# Patient Record
Sex: Male | Born: 1965 | Hispanic: No | Marital: Married | Smoking: Former smoker
Health system: Southern US, Community
[De-identification: ages and names within clinical notes are randomized; demographics above are authoritative.]

## PROBLEM LIST (undated history)

## (undated) ENCOUNTER — Emergency Department (HOSPITAL_COMMUNITY): Discharge status: Absent without leave | Payer: Self-pay

## (undated) DIAGNOSIS — K219 Gastro-esophageal reflux disease without esophagitis: Secondary | ICD-10-CM

## (undated) DIAGNOSIS — G4733 Obstructive sleep apnea (adult) (pediatric): Secondary | ICD-10-CM

## (undated) DIAGNOSIS — K589 Irritable bowel syndrome without diarrhea: Secondary | ICD-10-CM

## (undated) DIAGNOSIS — N400 Enlarged prostate without lower urinary tract symptoms: Secondary | ICD-10-CM

## (undated) DIAGNOSIS — R519 Headache, unspecified: Secondary | ICD-10-CM

## (undated) DIAGNOSIS — N200 Calculus of kidney: Secondary | ICD-10-CM

## (undated) DIAGNOSIS — D563 Thalassemia minor: Secondary | ICD-10-CM

## (undated) DIAGNOSIS — R51 Headache: Secondary | ICD-10-CM

## (undated) HISTORY — DX: Obstructive sleep apnea (adult) (pediatric): G47.33

## (undated) HISTORY — DX: Thalassemia minor: D56.3

## (undated) HISTORY — DX: Gastro-esophageal reflux disease without esophagitis: K21.9

## (undated) HISTORY — PX: NO PAST SURGERIES: SHX2092

## (undated) HISTORY — DX: Benign prostatic hyperplasia without lower urinary tract symptoms: N40.0

## (undated) HISTORY — DX: Irritable bowel syndrome without diarrhea: K58.9

## (undated) HISTORY — DX: Calculus of kidney: N20.0

---

## 1999-12-27 ENCOUNTER — Ambulatory Visit (HOSPITAL_COMMUNITY): Admission: RE | Admit: 1999-12-27 | Discharge: 1999-12-27 | Payer: Self-pay | Admitting: Gastroenterology

## 2014-10-11 ENCOUNTER — Other Ambulatory Visit: Payer: Self-pay | Admitting: Internal Medicine

## 2014-10-11 ENCOUNTER — Ambulatory Visit
Admission: RE | Admit: 2014-10-11 | Discharge: 2014-10-11 | Disposition: A | Discharge status: Home / Self Care | Payer: 59 | Source: Ambulatory Visit | Attending: Internal Medicine | Admitting: Internal Medicine

## 2014-10-11 DIAGNOSIS — J209 Acute bronchitis, unspecified: Secondary | ICD-10-CM

## 2015-05-01 ENCOUNTER — Other Ambulatory Visit: Payer: Self-pay | Admitting: Internal Medicine

## 2015-05-01 DIAGNOSIS — M545 Low back pain: Secondary | ICD-10-CM

## 2015-05-05 ENCOUNTER — Inpatient Hospital Stay: Admission: RE | Admit: 2015-05-05 | Payer: 59 | Source: Ambulatory Visit

## 2015-05-10 ENCOUNTER — Ambulatory Visit
Admission: RE | Admit: 2015-05-10 | Discharge: 2015-05-10 | Disposition: A | Discharge status: Home / Self Care | Payer: 59 | Source: Ambulatory Visit | Attending: Internal Medicine | Admitting: Internal Medicine

## 2015-05-10 ENCOUNTER — Other Ambulatory Visit: Payer: Self-pay | Admitting: Gastroenterology

## 2015-05-10 DIAGNOSIS — M545 Low back pain: Secondary | ICD-10-CM

## 2015-06-16 ENCOUNTER — Encounter (HOSPITAL_COMMUNITY): Payer: Self-pay | Admitting: *Deleted

## 2015-06-26 ENCOUNTER — Ambulatory Visit (HOSPITAL_COMMUNITY): Payer: 59 | Admitting: Anesthesiology

## 2015-06-26 ENCOUNTER — Ambulatory Visit (HOSPITAL_COMMUNITY)
Admission: RE | Admit: 2015-06-26 | Discharge: 2015-06-26 | Disposition: A | Discharge status: Home / Self Care | Payer: 59 | Source: Ambulatory Visit | Attending: Gastroenterology | Admitting: Gastroenterology

## 2015-06-26 ENCOUNTER — Encounter (HOSPITAL_COMMUNITY): Payer: Self-pay | Admitting: Anesthesiology

## 2015-06-26 ENCOUNTER — Encounter (HOSPITAL_COMMUNITY)
Admission: RE | Disposition: A | Discharge status: Home / Self Care | Payer: Self-pay | Source: Ambulatory Visit | Attending: Gastroenterology

## 2015-06-26 DIAGNOSIS — K621 Rectal polyp: Secondary | ICD-10-CM | POA: Insufficient documentation

## 2015-06-26 DIAGNOSIS — G4733 Obstructive sleep apnea (adult) (pediatric): Secondary | ICD-10-CM | POA: Diagnosis not present

## 2015-06-26 DIAGNOSIS — Z1211 Encounter for screening for malignant neoplasm of colon: Secondary | ICD-10-CM | POA: Insufficient documentation

## 2015-06-26 DIAGNOSIS — Z87891 Personal history of nicotine dependence: Secondary | ICD-10-CM | POA: Insufficient documentation

## 2015-06-26 HISTORY — PX: COLONOSCOPY WITH PROPOFOL: SHX5780

## 2015-06-26 HISTORY — DX: Headache: R51

## 2015-06-26 HISTORY — DX: Headache, unspecified: R51.9

## 2015-06-26 SURGERY — COLONOSCOPY WITH PROPOFOL
Anesthesia: Monitor Anesthesia Care

## 2015-06-26 MED ORDER — PROPOFOL 500 MG/50ML IV EMUL
INTRAVENOUS | Status: DC | PRN
  Administered 2015-06-26: 50 mg via INTRAVENOUS
  Administered 2015-06-26: 20 mg via INTRAVENOUS

## 2015-06-26 MED ORDER — MIDAZOLAM HCL 5 MG/5ML IJ SOLN
INTRAMUSCULAR | Status: DC | PRN
  Administered 2015-06-26: 2 mg via INTRAVENOUS

## 2015-06-26 MED ORDER — PROPOFOL 10 MG/ML IV BOLUS
INTRAVENOUS | Status: AC
  Filled 2015-06-26: qty 20

## 2015-06-26 MED ORDER — MIDAZOLAM HCL 2 MG/2ML IJ SOLN
INTRAMUSCULAR | Status: AC
  Filled 2015-06-26: qty 2

## 2015-06-26 MED ORDER — PROPOFOL 500 MG/50ML IV EMUL
INTRAVENOUS | Status: DC | PRN
  Administered 2015-06-26: 125 ug/kg/min via INTRAVENOUS

## 2015-06-26 MED ORDER — LACTATED RINGERS IV SOLN
INTRAVENOUS | Status: DC
  Administered 2015-06-26: 13:00:00 via INTRAVENOUS
  Administered 2015-06-26: 1000 mL via INTRAVENOUS

## 2015-06-26 MED ORDER — SODIUM CHLORIDE 0.9 % IV SOLN: INTRAVENOUS | Status: DC

## 2015-06-26 SURGICAL SUPPLY — 22 items

## 2015-06-26 NOTE — Anesthesia Postprocedure Evaluation (Signed)
Anesthesia Post Note  Patient: Adam Ball  Procedure(s) Performed: Procedure(s) (LRB): COLONOSCOPY WITH PROPOFOL (N/A)  Patient location during evaluation: PACU Anesthesia Type: MAC Level of consciousness: awake and alert Pain management: pain level controlled Vital Signs Assessment: post-procedure vital signs reviewed and stable Respiratory status: spontaneous breathing, nonlabored ventilation, respiratory function stable and patient connected to nasal cannula oxygen Cardiovascular status: stable and blood pressure returned to baseline Anesthetic complications: no    Last Vitals:  Filed Vitals:   06/26/15 1340 06/26/15 1350  BP: 113/68 118/83  Pulse: 68 69  Temp:    Resp: 17 19    Last Pain: There were no vitals filed for this visit.               Kennieth RadFitzgerald, Liba Hulsey E

## 2015-06-26 NOTE — Discharge Instructions (Signed)
Colonoscopy, Care After °Refer to this sheet in the next few weeks. These instructions provide you with information on caring for yourself after your procedure. Your health care provider may also give you more specific instructions. Your treatment has been planned according to current medical practices, but problems sometimes occur. Call your health care provider if you have any problems or questions after your procedure. °WHAT TO EXPECT AFTER THE PROCEDURE  °After your procedure, it is typical to have the following: °· A small amount of blood in your stool. °· Moderate amounts of gas and mild abdominal cramping or bloating. °HOME CARE INSTRUCTIONS °· Do not drive, operate machinery, or sign important documents for 24 hours. °· You may shower and resume your regular physical activities, but move at a slower pace for the first 24 hours. °· Take frequent rest periods for the first 24 hours. °· Walk around or put a warm pack on your abdomen to help reduce abdominal cramping and bloating. °· Drink enough fluids to keep your urine clear or pale yellow. °· You may resume your normal diet as instructed by your health care provider. Avoid heavy or fried foods that are hard to digest. °· Avoid drinking alcohol for 24 hours or as instructed by your health care provider. °· Only take over-the-counter or prescription medicines as directed by your health care provider. °· If a tissue sample (biopsy) was taken during your procedure: °¨ Do not take aspirin or blood thinners for 7 days, or as instructed by your health care provider. °¨ Do not drink alcohol for 7 days, or as instructed by your health care provider. °¨ Eat soft foods for the first 24 hours. °SEEK MEDICAL CARE IF: °You have persistent spotting of blood in your stool 2-3 days after the procedure. °SEEK IMMEDIATE MEDICAL CARE IF: °· You have more than a small spotting of blood in your stool. °· You pass large blood clots in your stool. °· Your abdomen is swollen  (distended). °· You have nausea or vomiting. °· You have a fever. °· You have increasing abdominal pain that is not relieved with medicine. °  °This information is not intended to replace advice given to you by your health care provider. Make sure you discuss any questions you have with your health care provider. °  °Document Released: 11/21/2003 Document Revised: 01/27/2013 Document Reviewed: 12/14/2012 °Elsevier Interactive Patient Education ©2016 [employer] ° °

## 2015-06-26 NOTE — H&P (Signed)
  Procedure: Baseline screening colonoscopy  History: The patient is a 50 year old male born 01/07/1966. He is scheduled to undergo his first screening colonoscopy with polypectomy to prevent colon cancer.  Medication allergies: None  Family history: Negative for colon cancer  Past medical history: Migraine headache syndrome. Gastroesophageal reflux. Constipation predominant irritable bowel syndrome. Obstructive sleep apnea syndrome. Kidney stone. Tonsillectomy.? Thalassemia trait  Exam: Patient is alert and lying comfortably on the endoscopy stretcher. Abdomen is soft and nontender to palpation. Lungs clear to auscultation. Cardiac exam reveals a regular rhythm.  Plan: Proceed with screening colonoscopy

## 2015-06-26 NOTE — Transfer of Care (Signed)
Immediate Anesthesia Transfer of Care Note  Patient: Adam Ball  Procedure(s) Performed: Procedure(s): COLONOSCOPY WITH PROPOFOL (N/A)  Patient Location: PACU  Anesthesia Type:MAC  Level of Consciousness:  sedated, patient cooperative and responds to stimulation  Airway & Oxygen Therapy:Patient Spontanous Breathing and Patient connected to face mask oxgen  Post-op Assessment:  Report given to PACU RN and Post -op Vital signs reviewed and stable  Post vital signs:  Reviewed and stable  Last Vitals:  Filed Vitals:   06/26/15 1209  BP: 135/81  Pulse: 77  Temp: 36.6 C  Resp: 20    Complications: No apparent anesthesia complications

## 2015-06-26 NOTE — Anesthesia Preprocedure Evaluation (Addendum)
Anesthesia Evaluation  Patient identified by MRN, date of birth, ID band Patient awake    Reviewed: Allergy & Precautions, NPO status , Patient's Chart, lab work & pertinent test results  Airway Mallampati: III  TM Distance: >3 FB Neck ROM: Full  Mouth opening: Limited Mouth Opening  Dental   Pulmonary former smoker,    breath sounds clear to auscultation       Cardiovascular negative cardio ROS   Rhythm:Regular Rate:Normal     Neuro/Psych  Headaches,    GI/Hepatic negative GI ROS, Neg liver ROS,   Endo/Other  negative endocrine ROS  Renal/GU negative Renal ROS     Musculoskeletal   Abdominal   Peds  Hematology negative hematology ROS (+)   Anesthesia Other Findings   Reproductive/Obstetrics                            Anesthesia Physical Anesthesia Plan  ASA: I  Anesthesia Plan: MAC   Post-op Pain Management:    Induction: Intravenous  Airway Management Planned: Natural Airway and Simple Face Mask  Additional Equipment:   Intra-op Plan:   Post-operative Plan:   Informed Consent: I have reviewed the patients History and Physical, chart, labs and discussed the procedure including the risks, benefits and alternatives for the proposed anesthesia with the patient or authorized representative who has indicated his/her understanding and acceptance.     Plan Discussed with: CRNA  Anesthesia Plan Comments:         Anesthesia Quick Evaluation

## 2015-06-26 NOTE — Op Note (Signed)
Procedure: Baseline screening colonoscopy  Endoscopist: Danise EdgeMartin Cynthia Cogle  Premedication: Propofol administered by anesthesia  Procedure: The patient was placed in the left lateral decubitus position. Anal inspection and digital rectal exam were normal. The Pentax pediatric colonoscope was introduced into the rectum and advanced to the cecum. A normal-appearing appendiceal orifice and ileocecal valve were identified. Colonic preparation for the exam today was good. Withdrawal time was 13 minutes  Rectum. A 5 mm sessile polyp was removed with the cold snare from the mid rectum. Retroflexed view of the distal rectum was normal  Sigmoid colon and descending colon. Normal  Splenic flexure. Normal  Transverse colon. Normal  Hepatic flexure. Normal  Ascending colon. Normal  Cecum and ileocecal valve. Normal  Assessment: A small polyp was removed from the rectum. Otherwise normal colonoscopy  Recommendation: If the polyp returns adenomatous pathologically, schedule colonoscopy in 5 years. If the polyp returns hyperplastic, schedule repeat colonoscopy in 10 years

## 2015-11-22 ENCOUNTER — Ambulatory Visit
Admission: RE | Admit: 2015-11-22 | Discharge: 2015-11-22 | Disposition: A | Discharge status: Home / Self Care | Payer: 59 | Source: Ambulatory Visit | Attending: Internal Medicine | Admitting: Internal Medicine

## 2015-11-22 ENCOUNTER — Other Ambulatory Visit: Payer: Self-pay | Admitting: Internal Medicine

## 2015-11-22 DIAGNOSIS — M542 Cervicalgia: Secondary | ICD-10-CM

## 2016-08-15 ENCOUNTER — Other Ambulatory Visit: Payer: Self-pay | Admitting: Internal Medicine

## 2016-08-15 ENCOUNTER — Ambulatory Visit
Admission: RE | Admit: 2016-08-15 | Discharge: 2016-08-15 | Disposition: A | Discharge status: Home / Self Care | Payer: 59 | Source: Ambulatory Visit | Attending: Internal Medicine | Admitting: Internal Medicine

## 2016-08-15 DIAGNOSIS — M549 Dorsalgia, unspecified: Secondary | ICD-10-CM

## 2019-06-23 ENCOUNTER — Encounter: Payer: Self-pay | Admitting: Neurology

## 2019-06-23 ENCOUNTER — Other Ambulatory Visit: Payer: Self-pay

## 2019-06-23 ENCOUNTER — Ambulatory Visit: Payer: Managed Care, Other (non HMO) | Admitting: Neurology

## 2019-06-23 DIAGNOSIS — F5104 Psychophysiologic insomnia: Secondary | ICD-10-CM | POA: Diagnosis not present

## 2019-06-23 DIAGNOSIS — G43019 Migraine without aura, intractable, without status migrainosus: Secondary | ICD-10-CM

## 2019-06-23 HISTORY — DX: Psychophysiologic insomnia: F51.04

## 2019-06-23 HISTORY — DX: Migraine without aura, intractable, without status migrainosus: G43.019

## 2019-06-23 MED ORDER — TOPIRAMATE 25 MG PO TABS: 75.0000 mg | ORAL_TABLET | Freq: Every day | ORAL | 3 refills | Status: DC

## 2019-06-23 MED ORDER — DICLOFENAC POTASSIUM 50 MG PO TABS: 50.0000 mg | ORAL_TABLET | Freq: Three times a day (TID) | ORAL | 3 refills | Status: DC | PRN

## 2019-06-23 MED ORDER — SUMATRIPTAN SUCCINATE 100 MG PO TABS: 100.0000 mg | ORAL_TABLET | Freq: Two times a day (BID) | ORAL | 3 refills | Status: DC | PRN

## 2019-06-23 NOTE — Patient Instructions (Signed)
We will continue to increase the Topamax to 50 mg for one week, then take 75 mg at night. Call for any dose adjustments.  We will convert to the 100 mg Imitrex tablets, and alternate with diclofenac potassium 50 mg.   Topamax (topiramate) is a seizure medication that has an FDA approval for seizures and for migraine headache. Potential side effects of this medication include weight loss, cognitive slowing, tingling in the fingers and toes, and carbonated drinks will taste bad. If any significant side effects are noted on this drug, please contact our office.

## 2019-06-23 NOTE — Progress Notes (Signed)
Reason for visit: Intractable migraine  Referring physician: Dr. Drue Flirt Upchurch is a 54 y.o. male  History of present illness:  Adam Ball is a 54 year old left-handed [place] male with a lifelong history of migraine headache.  He claims that he began having migraine at age 60 or 54 years old.  He generally has had 2 or 3 headaches a month, sometimes the headaches may last several days.  [facility], he was given some Imitrex to take if needed.  As a child he would take aspirin which seem to be effective in controlling the headache.  The patient has had an increase in the frequency of the headaches over the last several months however.  He is now having headaches about every other day.  The headaches generally start in the right frontal area, they are unassociated with nausea or vomiting.  At times he may have some neck pain with the headache.  He denies any visual complaints or any numbness or weakness of the face, arms, legs.  He may have some difficulty focusing during the headache.  His job requires that he is looking at a computer most of the day.  He denies any activating factors for the headache.  He denies a family history of headache.  He has been on Imitrex taking 50 mg daily, this used to work well for him but is becoming ineffective.  He is having more headaches then he has Imitrex tablets now.  The patient also reports a history of chronic insomnia.  He is sent to this office for further evaluation.  He has been placed on Topamax, he currently is on 25 mg a day.  He initially went off of Topamax because he started having some discomfort in some tingling in the thumb and index finger on the left hand, he has seen an orthopedic surgeon for this.  Past Medical History:  Diagnosis Date  . BPH (benign prostatic hyperplasia)   . Chronic insomnia 06/23/2019  . Common migraine with intractable migraine 06/23/2019  . GERD (gastroesophageal  reflux disease)   . Headache    migraines  . IBS (irritable bowel syndrome)   . OSA on CPAP   . Renal calculi   . Thalassemia trait     Past Surgical History:  Procedure Laterality Date  . COLONOSCOPY WITH PROPOFOL N/A 06/26/2015   Procedure: COLONOSCOPY WITH PROPOFOL;  Surgeon: Charolett Bumpers, MD;  Location: [facility];  Service: Endoscopy;  Laterality: N/A;  . NO PAST SURGERIES      History reviewed. No pertinent family history.  Social history:  reports that he has quit smoking. His smoking use included cigarettes. He has never used smokeless tobacco. He reports that he does not drink alcohol or use drugs.  Medications:  Prior to Admission medications   Medication Sig Start Date End Date Taking? Authorizing Provider  famotidine (PEPCID) 20 MG tablet Take 20 mg by mouth as needed for heartburn or indigestion.   Yes [provider]  Melatonin 5 MG TABS Take 5 mg by mouth at bedtime.   Yes [provider]  SUMAtriptan (IMITREX) 25 MG tablet Take 50 mg by mouth every 2 (two) hours as needed for migraine. May repeat in 2 hours if headache persists or recurs.    Yes [provider]  topiramate (TOPAMAX) 50 MG tablet Take 50 mg by mouth daily.   Yes [provider]     No Known Allergies  ROS:  Out of a complete 14 system review of symptoms, the patient complains only of the following symptoms, and all other reviewed systems are negative.  Headache Left hand pain Insomnia, fatigue Snoring, constipation  Blood pressure 126/79, pulse 82, temperature 97.7 F (36.5 C), height 5\' 6"  (1.676 m), weight 180 lb 6.4 oz (81.8 kg).  Physical Exam  General: The patient is alert and cooperative at the time of the examination.  The patient is mildly to moderately obese.  Eyes: Pupils are equal, round, and reactive to light. Discs are flat bilaterally.  Neck: The neck is supple, no carotid bruits are noted.  Respiratory: The respiratory examination  is clear.  Cardiovascular: The cardiovascular examination reveals a regular rate and rhythm, no obvious murmurs or rubs are noted.  Neuromuscular: Range of movement the cervical spine is full.  No crepitus is noted in the temporomandibular joints on this side.  Tinel's sign at the wrists are negative bilaterally.  Skin: Extremities are without significant edema.  Neurologic Exam  Mental status: The patient is alert and oriented x 3 at the time of the examination. The patient has apparent normal recent and remote memory, with an apparently normal attention span and concentration ability.  Cranial nerves: Facial symmetry is present. There is good sensation of the face to pinprick and soft touch bilaterally. The strength of the facial muscles and the muscles to head turning and shoulder shrug are normal bilaterally. Speech is well enunciated, no aphasia or dysarthria is noted. Extraocular movements are full. Visual fields are full. The tongue is midline, and the patient has symmetric elevation of the soft palate. No obvious hearing deficits are noted.  Motor: The motor testing reveals 5 over 5 strength of all 4 extremities. Good symmetric motor tone is noted throughout.  Sensory: Sensory testing is intact to pinprick, soft touch, vibration sensation, and position sense on all 4 extremities. No evidence of extinction is noted.  Coordination: Cerebellar testing reveals good finger-nose-finger and heel-to-shin bilaterally.  Gait and station: Gait is normal. Tandem gait is normal. Romberg is negative. No drift is seen.  Reflexes: Deep tendon reflexes are symmetric and normal bilaterally. Toes are downgoing bilaterally.   Assessment/Plan:  1.  Common migraine headache, intractable  2.  Chronic insomnia  The patient has had an increase in his headaches recently.  He has been placed on Topamax, this is the first time he is ever been on a daily preventative medication.  We will need to give this  medication a fair trial, he will go to 50 mg at night for a week and then go to 75 mg at night.  He will call for any dose adjustment.  If the Topamax is not effective, may switch to amitriptyline or nortriptyline to treat to the headache and the insomnia.  He will follow-up in 3 months.  Jill Alexanders MD 06/23/2019 11:55 AM  [facility] [address] [place], [place] [address]  Phone [phone] Fax [phone]

## 2019-10-07 ENCOUNTER — Ambulatory Visit: Payer: Managed Care, Other (non HMO) | Admitting: Neurology

## 2020-01-03 ENCOUNTER — Ambulatory Visit: Payer: Managed Care, Other (non HMO) | Admitting: Neurology

## 2020-01-03 ENCOUNTER — Encounter: Payer: Self-pay | Admitting: Neurology

## 2020-01-03 NOTE — Progress Notes (Deleted)
PATIENT: Adam Ball DOB: 1965-08-30  REASON FOR VISIT: follow up HISTORY FROM: patient  HISTORY OF PRESENT ILLNESS: Today 01/03/20 Adam Ball is 54 year old male with history of migraine headache.  HISTORY 06/23/2019 Dr. Anne Hahn: Adam Ball is a 54 year old left-handed [place] male with a lifelong history of migraine headache.  He claims that he began having migraine at age 52 or 54 years old.  He generally has had 2 or 3 headaches a month, sometimes the headaches may last several days.  [facility], he was given some Imitrex to take if needed.  As a child he would take aspirin which seem to be effective in controlling the headache.  The patient has had an increase in the frequency of the headaches over the last several months however.  He is now having headaches about every other day.  The headaches generally start in the right frontal area, they are unassociated with nausea or vomiting.  At times he may have some neck pain with the headache.  He denies any visual complaints or any numbness or weakness of the face, arms, legs.  He may have some difficulty focusing during the headache.  His job requires that he is looking at a computer most of the day.  He denies any activating factors for the headache.  He denies a family history of headache.  He has been on Imitrex taking 50 mg daily, this used to work well for him but is becoming ineffective.  He is having more headaches then he has Imitrex tablets now.  The patient also reports a history of chronic insomnia.  He is sent to this office for further evaluation.  He has been placed on Topamax, he currently is on 25 mg a day.  He initially went off of Topamax because he started having some discomfort in some tingling in the thumb and index finger on the left hand, he has seen an orthopedic surgeon for this.   REVIEW OF SYSTEMS: Out of a complete 14 system review of symptoms, the patient complains only of  the following symptoms, and all other reviewed systems are negative.  ALLERGIES: No Known Allergies  HOME MEDICATIONS: Outpatient Medications Prior to Visit  Medication Sig Dispense Refill  . diclofenac (CATAFLAM) 50 MG tablet Take 1 tablet (50 mg total) by mouth 3 (three) times daily as needed (for migraine headache). 30 tablet 3  . famotidine (PEPCID) 20 MG tablet Take 20 mg by mouth as needed for heartburn or indigestion.    . Melatonin 5 MG TABS Take 5 mg by mouth at bedtime.    . SUMAtriptan (IMITREX) 100 MG tablet Take 1 tablet (100 mg total) by mouth 2 (two) times daily as needed for up to 1 dose for migraine. 10 tablet 3  . topiramate (TOPAMAX) 25 MG tablet Take 3 tablets (75 mg total) by mouth at bedtime. 90 tablet 3   No facility-administered medications prior to visit.    PAST MEDICAL HISTORY: Past Medical History:  Diagnosis Date  . BPH (benign prostatic hyperplasia)   . Chronic insomnia 06/23/2019  . Common migraine with intractable migraine 06/23/2019  . GERD (gastroesophageal reflux disease)   . Headache    migraines  . IBS (irritable bowel syndrome)   . OSA on CPAP   . Renal calculi   . Thalassemia trait     PAST SURGICAL HISTORY: Past Surgical History:  Procedure Laterality Date  . COLONOSCOPY WITH PROPOFOL N/A 06/26/2015  Procedure: COLONOSCOPY WITH PROPOFOL;  Surgeon: Charolett Bumpers, MD;  Location: [facility];  Service: Endoscopy;  Laterality: N/A;  . NO PAST SURGERIES      FAMILY HISTORY: No family history on file.  SOCIAL HISTORY: Social History   Socioeconomic History  . Marital status: Married    Spouse name: Not on file  . Number of children: 2  . Years of education: BA  . Highest education level: Not on file  Occupational History  . Occupation: Thermo Geophysicist/field seismologist  Tobacco Use  . Smoking status: Former Smoker    Types: Cigarettes  . Smokeless tobacco: Never Used  Substance and Sexual Activity  . Alcohol use: No  . Drug use: No    . Sexual activity: Not on file  Other Topics Concern  . Not on file  Social History Narrative   Left handed   Soda- 1-2/week or less   Lives with wife and daughter   Social Determinants of Health   Financial Resource Strain:   . Difficulty of Paying Living Expenses: Not on file  Food Insecurity:   . Worried About Programme researcher, broadcasting/film/video in the Last Year: Not on file  . Ran Out of Food in the Last Year: Not on file  Transportation Needs:   . Lack of Transportation (Medical): Not on file  . Lack of Transportation (Non-Medical): Not on file  Physical Activity:   . Days of Exercise per Week: Not on file  . Minutes of Exercise per Session: Not on file  Stress:   . Feeling of Stress : Not on file  Social Connections:   . Frequency of Communication with Friends and Family: Not on file  . Frequency of Social Gatherings with Friends and Family: Not on file  . Attends Religious Services: Not on file  . Active Member of Clubs or Organizations: Not on file  . Attends Banker Meetings: Not on file  . Marital Status: Not on file  Intimate Partner Violence:   . Fear of Current or Ex-Partner: Not on file  . Emotionally Abused: Not on file  . Physically Abused: Not on file  . Sexually Abused: Not on file      PHYSICAL EXAM  There were no vitals filed for this visit. There is no height or weight on file to calculate BMI.  Generalized: Well developed, in no acute distress   Neurological examination  Mentation: Alert oriented to time, place, history taking. Follows all commands speech and language fluent Cranial nerve II-XII: Pupils were equal round reactive to light. Extraocular movements were full, visual field were full on confrontational test. Facial sensation and strength were normal. Uvula tongue midline. Head turning and shoulder shrug  were normal and symmetric. Motor: The motor testing reveals 5 over 5 strength of all 4 extremities. Good symmetric motor tone is noted  throughout.  Sensory: Sensory testing is intact to soft touch on all 4 extremities. No evidence of extinction is noted.  Coordination: Cerebellar testing reveals good finger-nose-finger and heel-to-shin bilaterally.  Gait and station: Gait is normal. Tandem gait is normal. Romberg is negative. No drift is seen.  Reflexes: Deep tendon reflexes are symmetric and normal bilaterally.   DIAGNOSTIC DATA (LABS, IMAGING, TESTING) - I reviewed patient records, labs, notes, testing and imaging myself where available.  No results found for: WBC, HGB, HCT, MCV, PLT No results found for: NA, K, CL, CO2, GLUCOSE, BUN, CREATININE, CALCIUM, PROT, ALBUMIN, AST, ALT, ALKPHOS, BILITOT, GFRNONAA, GFRAA No results  found for: CHOL, HDL, LDLCALC, LDLDIRECT, TRIG, CHOLHDL No results found for: OMBT5H No results found for: VITAMINB12 No results found for: TSH    ASSESSMENT AND PLAN 54 y.o. year old male  has a past medical history of BPH (benign prostatic hyperplasia), Chronic insomnia (06/23/2019), Common migraine with intractable migraine (06/23/2019), GERD (gastroesophageal reflux disease), Headache, IBS (irritable bowel syndrome), OSA on CPAP, Renal calculi, and Thalassemia trait. here with ***   I spent 15 minutes with the patient. 50% of this time was spent   Margie Ege, [place], DNP 01/03/2020, 5:53 AM [facility] [address], [address] [place], [place] [address] [phone]

## 2020-01-24 NOTE — Progress Notes (Signed)
PATIENT: Adam Ball DOB: 11-14-1965  REASON FOR VISIT: follow up HISTORY FROM: patient  HISTORY OF PRESENT ILLNESS: Today 01/25/20  Adam Ball is a 54 year old male with history of migraine headache.  Tried Topamax 75 mg at bedtime, no benefit, feels it made his sleeping worse, had numbness to his left thumb and first finger.  Missed his last appointment, was in [place] for 3 months.  Has chronic insomnia, however the last several months, has reported waking up with apnea spells, gasping, primary doctor mentioned need for sleep study.  No morning headache in particular.  On average, migraine 1-2 times a week, it may last up to 3 days, takes Imitrex with good benefit.  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  Diclofenac potassium was not helpful.  Presents today for evaluation unaccompanied.  HISTORY 06/23/2019 Dr. Anne Hahn: Adam Ball is a 54 year old left-handed [place] male with a lifelong history of migraine headache.  He claims that he began having migraine at age 10 or 54 years old.  He generally has had 2 or 3 headaches a month, sometimes the headaches may last several days.  [facility], he was given some Imitrex to take if needed.  As a child he would take aspirin which seem to be effective in controlling the headache.  The patient has had an increase in the frequency of the headaches over the last several months however.  He is now having headaches about every other day.  The headaches generally start in the right frontal area, they are unassociated with nausea or vomiting.  At times he may have some neck pain with the headache.  He denies any visual complaints or any numbness or weakness of the face, arms, legs.  He may have some difficulty focusing during the headache.  His job requires that he is looking at a computer most of the day.  He denies any activating factors for the headache.  He denies a family history of headache.  He  has been on Imitrex taking 50 mg daily, this used to work well for him but is becoming ineffective.  He is having more headaches then he has Imitrex tablets now.  The patient also reports a history of chronic insomnia.  He is sent to this office for further evaluation.  He has been placed on Topamax, he currently is on 25 mg a day.  He initially went off of Topamax because he started having some discomfort in some tingling in the thumb and index finger on the left hand, he has seen an orthopedic surgeon for this.   REVIEW OF SYSTEMS: Out of a complete 14 system review of symptoms, the patient complains only of the following symptoms, and all other reviewed systems are negative.  Headache  ALLERGIES: No Known Allergies  HOME MEDICATIONS: Outpatient Medications Prior to Visit  Medication Sig Dispense Refill   Melatonin 5 MG TABS Take 5 mg by mouth at bedtime.     diclofenac (CATAFLAM) 50 MG tablet Take 1 tablet (50 mg total) by mouth 3 (three) times daily as needed (for migraine headache). 30 tablet 3   famotidine (PEPCID) 20 MG tablet Take 20 mg by mouth as needed for heartburn or indigestion.     SUMAtriptan (IMITREX) 100 MG tablet Take 1 tablet (100 mg total) by mouth 2 (two) times daily as needed for up to 1 dose for migraine. 10 tablet 3   topiramate (TOPAMAX) 25  MG tablet Take 3 tablets (75 mg total) by mouth at bedtime. 90 tablet 3   No facility-administered medications prior to visit.    PAST MEDICAL HISTORY: Past Medical History:  Diagnosis Date   BPH (benign prostatic hyperplasia)    Chronic insomnia 06/23/2019   Common migraine with intractable migraine 06/23/2019   GERD (gastroesophageal reflux disease)    Headache    migraines   IBS (irritable bowel syndrome)    OSA on CPAP    Renal calculi    Thalassemia trait     PAST SURGICAL HISTORY: Past Surgical History:  Procedure Laterality Date   COLONOSCOPY WITH PROPOFOL N/A 06/26/2015   Procedure: COLONOSCOPY  WITH PROPOFOL;  Surgeon: Charolett Bumpers, MD;  Location: [facility];  Service: Endoscopy;  Laterality: N/A;   NO PAST SURGERIES      FAMILY HISTORY: No family history on file.  SOCIAL HISTORY: Social History   Socioeconomic History   Marital status: Married    Spouse name: Not on file   Number of children: 2   Years of education: BA   Highest education level: Not on file  Occupational History   Occupation: Thermo Geophysicist/field seismologist  Tobacco Use   Smoking status: Former Smoker    Types: Cigarettes   Smokeless tobacco: Never Used  Substance and Sexual Activity   Alcohol use: No   Drug use: No   Sexual activity: Not on file  Other Topics Concern   Not on file  Social History Narrative   Left handed   Soda- 1-2/week or less   Lives with wife and daughter   Social Determinants of Health   Financial Resource Strain:    Difficulty of Paying Living Expenses: Not on file  Food Insecurity:    Worried About Programme researcher, broadcasting/film/video in the Last Year: Not on file   [employer] of Food in the Last Year: Not on file  Transportation Needs:    Lack of Transportation (Medical): Not on file   Lack of Transportation (Non-Medical): Not on file  Physical Activity:    Days of Exercise per Week: Not on file   Minutes of Exercise per Session: Not on file  Stress:    Feeling of Stress : Not on file  Social Connections:    Frequency of Communication with Friends and Family: Not on file   Frequency of Social Gatherings with Friends and Family: Not on file   Attends Religious Services: Not on file   Active Member of Clubs or Organizations: Not on file   Attends Banker Meetings: Not on file   Marital Status: Not on file  Intimate Partner Violence:    Fear of Current or Ex-Partner: Not on file   Emotionally Abused: Not on file   Physically Abused: Not on file   Sexually Abused: Not on file   PHYSICAL EXAM  Vitals:   01/25/20 1504  BP: 137/90    Pulse: 80  Weight: 180 lb 6.4 oz (81.8 kg)  Height: 5\' 6"  (1.676 m)   Body mass index is 29.12 kg/m.  Generalized: Well developed, in no acute distress   Neurological examination  Mentation: Alert oriented to time, place, history taking. Follows all commands speech and language fluent Cranial nerve II-XII: Pupils were equal round reactive to light. Extraocular movements were full, visual field were full on confrontational test. Facial sensation and strength were normal.  Head turning and shoulder shrug  were normal and symmetric. Motor: The motor testing reveals 5  over 5 strength of all 4 extremities. Good symmetric motor tone is noted throughout.  Sensory: Sensory testing is intact to soft touch on all 4 extremities. No evidence of extinction is noted.  Coordination: Cerebellar testing reveals good finger-nose-finger and heel-to-shin bilaterally.  Gait and station: Gait is normal.  Reflexes: Deep tendon reflexes are symmetric and normal bilaterally.   DIAGNOSTIC DATA (LABS, IMAGING, TESTING) - I reviewed patient records, labs, notes, testing and imaging myself where available.  No results found for: WBC, HGB, HCT, MCV, PLT No results found for: NA, K, CL, CO2, GLUCOSE, BUN, CREATININE, CALCIUM, PROT, ALBUMIN, AST, ALT, ALKPHOS, BILITOT, GFRNONAA, GFRAA No results found for: CHOL, HDL, LDLCALC, LDLDIRECT, TRIG, CHOLHDL No results found for: UXNA3F No results found for: VITAMINB12 No results found for: TSH  ASSESSMENT AND PLAN 54 y.o. year old male  has a past medical history of BPH (benign prostatic hyperplasia), Chronic insomnia (06/23/2019), Common migraine with intractable migraine (06/23/2019), GERD (gastroesophageal reflux disease), Headache, IBS (irritable bowel syndrome), OSA on CPAP, Renal calculi, and Thalassemia trait. here with:  1.  Migraine headache 2.  Chronic insomnia -Could not tolerate Topamax -Start nortriptyline working up to 30 mg at bedtime -Continue Imitrex as  needed for acute headache -Refer for sleep evaluation, due to reported apnea spells during the night (reportedly sleep study many years ago was normal, never been on CPAP) -Follow-up in 4 months or sooner if needed  I spent 30 minutes of face-to-face and non-face-to-face time with patient.  This included previsit chart review, lab review, study review, order entry, electronic health record documentation, patient education.  Margie Ege, AGNP-C, DNP 01/25/2020, 3:40 PM [facility] [address] [place], [place] [address] [phone]

## 2020-01-25 ENCOUNTER — Encounter: Payer: Self-pay | Admitting: Neurology

## 2020-01-25 ENCOUNTER — Other Ambulatory Visit: Payer: Self-pay

## 2020-01-25 ENCOUNTER — Ambulatory Visit: Payer: Managed Care, Other (non HMO) | Admitting: Neurology

## 2020-01-25 VITALS — BP 137/90 | HR 80 | Ht 66.0 in | Wt 180.4 lb

## 2020-01-25 DIAGNOSIS — F5104 Psychophysiologic insomnia: Secondary | ICD-10-CM

## 2020-01-25 DIAGNOSIS — G43019 Migraine without aura, intractable, without status migrainosus: Secondary | ICD-10-CM

## 2020-01-25 MED ORDER — NORTRIPTYLINE HCL 10 MG PO CAPS
ORAL_CAPSULE | ORAL | 5 refills | Status: DC
Start: 2020-01-25 — End: 2020-02-28

## 2020-01-25 MED ORDER — SUMATRIPTAN SUCCINATE 100 MG PO TABS: 100.0000 mg | ORAL_TABLET | Freq: Two times a day (BID) | ORAL | 3 refills | Status: DC | PRN

## 2020-01-25 NOTE — Progress Notes (Signed)
I have read the note, and I agree with the clinical assessment and plan.  Adam Ball   

## 2020-01-25 NOTE — Patient Instructions (Addendum)
Stop the Topamax  Start nortriptyline working up to 30 mg at bedtime for migraine prevention  Continue Imitrex as needed for acute headache Will refer you for sleep consultation   See you back in 4 months

## 2020-02-22 ENCOUNTER — Other Ambulatory Visit: Payer: Self-pay | Admitting: Urology

## 2020-02-22 NOTE — Progress Notes (Signed)
Attempted to reach patient regarding procedure on 02/21/2020. Left message.

## 2020-02-23 ENCOUNTER — Other Ambulatory Visit (HOSPITAL_COMMUNITY)
Admission: RE | Admit: 2020-02-23 | Discharge: 2020-02-23 | Disposition: A | Discharge status: Home / Self Care | Payer: Managed Care, Other (non HMO) | Source: Ambulatory Visit | Attending: Urology | Admitting: Urology

## 2020-02-23 DIAGNOSIS — Z01812 Encounter for preprocedural laboratory examination: Secondary | ICD-10-CM | POA: Diagnosis not present

## 2020-02-23 DIAGNOSIS — Z20822 Contact with and (suspected) exposure to covid-19: Secondary | ICD-10-CM | POA: Diagnosis not present

## 2020-02-23 LAB — SARS CORONAVIRUS 2 (TAT 6-24 HRS): SARS Coronavirus 2: NEGATIVE

## 2020-02-23 NOTE — Progress Notes (Signed)
Talked with patient. Instructions given. Arrival time 1315. NPO after MN. Clear liquids until 1100. Driver secured

## 2020-02-24 ENCOUNTER — Encounter (HOSPITAL_BASED_OUTPATIENT_CLINIC_OR_DEPARTMENT_OTHER): Payer: Self-pay | Admitting: Urology

## 2020-02-24 ENCOUNTER — Encounter (HOSPITAL_BASED_OUTPATIENT_CLINIC_OR_DEPARTMENT_OTHER)
Admission: RE | Disposition: A | Discharge status: Home / Self Care | Payer: Self-pay | Source: Home / Self Care | Attending: Urology

## 2020-02-24 ENCOUNTER — Ambulatory Visit (HOSPITAL_BASED_OUTPATIENT_CLINIC_OR_DEPARTMENT_OTHER)
Admission: RE | Admit: 2020-02-24 | Discharge: 2020-02-24 | Disposition: A | Discharge status: Home / Self Care | Payer: Managed Care, Other (non HMO) | Attending: Urology | Admitting: Urology

## 2020-02-24 ENCOUNTER — Ambulatory Visit (HOSPITAL_COMMUNITY): Payer: Managed Care, Other (non HMO)

## 2020-02-24 DIAGNOSIS — Z79899 Other long term (current) drug therapy: Secondary | ICD-10-CM | POA: Diagnosis not present

## 2020-02-24 DIAGNOSIS — N201 Calculus of ureter: Secondary | ICD-10-CM | POA: Insufficient documentation

## 2020-02-24 HISTORY — PX: EXTRACORPOREAL SHOCK WAVE LITHOTRIPSY: SHX1557

## 2020-02-24 SURGERY — LITHOTRIPSY, ESWL
Anesthesia: LOCAL | Laterality: Right

## 2020-02-24 MED ORDER — CIPROFLOXACIN HCL 500 MG PO TABS
500.0000 mg | ORAL_TABLET | ORAL | Status: AC
  Administered 2020-02-24: 500 mg via ORAL

## 2020-02-24 MED ORDER — DIPHENHYDRAMINE HCL 25 MG PO CAPS
25.0000 mg | ORAL_CAPSULE | ORAL | Status: AC
  Administered 2020-02-24: 25 mg via ORAL

## 2020-02-24 MED ORDER — TRAMADOL HCL 50 MG PO TABS: 50.0000 mg | ORAL_TABLET | Freq: Four times a day (QID) | ORAL | 0 refills | Status: DC | PRN

## 2020-02-24 MED ORDER — ONDANSETRON HCL 4 MG PO TABS: 4.0000 mg | ORAL_TABLET | Freq: Three times a day (TID) | ORAL | 0 refills | Status: DC | PRN

## 2020-02-24 MED ORDER — DIAZEPAM 5 MG PO TABS
10.0000 mg | ORAL_TABLET | ORAL | Status: AC
  Administered 2020-02-24: 10 mg via ORAL

## 2020-02-24 MED ORDER — DIPHENHYDRAMINE HCL 25 MG PO CAPS
ORAL_CAPSULE | ORAL | Status: AC
  Filled 2020-02-24: qty 1

## 2020-02-24 MED ORDER — DIAZEPAM 5 MG PO TABS
ORAL_TABLET | ORAL | Status: AC
  Filled 2020-02-24: qty 2

## 2020-02-24 MED ORDER — SODIUM CHLORIDE 0.9 % IV SOLN: INTRAVENOUS | Status: DC

## 2020-02-24 MED ORDER — CIPROFLOXACIN HCL 500 MG PO TABS
ORAL_TABLET | ORAL | Status: AC
  Filled 2020-02-24: qty 1

## 2020-02-24 NOTE — Op Note (Signed)
See [facility] OP note scanned into chart. Also because of the size, density, location and other factors that cannot be anticipated I feel this will likely be a staged procedure. This fact supersedes any indication in the scanned [facility] operative note to the contrary.  

## 2020-02-24 NOTE — Discharge Instructions (Signed)
See [facility] discharge instructions in chart.  

## 2020-02-24 NOTE — H&P (Signed)
CC: Flank pain   HPI: 54 year old male who presents with two weeks of intermittent sharp right sided flank pain. This began while he was in [place] over two weeks ago. The pain comes and goes, but has been progressively worsening. He also endorses some increased urinary frequency. He denies fevers, chills and gross hematuria. He is emptying his bladder well. He has not had a stone before.     ALLERGIES: None   MEDICATIONS: Sildenafil Citrate 20 mg tablet take 1-5 tabs as needed prior to sexual activity  Sleep Aid PRN  Sumatriptan PRN     GU PSH: Cystoscopy - 2019     NON-GU PSH: Tonsillectomy     GU PMH: BPH w/LUTS - 2019, - 2019 ED due to arterial insufficiency - 2019 Premature ejaculation - 2019 Weak Urinary Stream - 2019      PMH Notes: migraine headaches   NON-GU PMH: Anxiety Depression Sleep Apnea    FAMILY HISTORY: Death of family member - Father, Mother Hematuria - Father Kidney Stones - Father   SOCIAL HISTORY: Marital Status: Married Preferred Language: English Current Smoking Status: Patient has never smoked.   Tobacco Use Assessment Completed: Used Tobacco in last 30 days? Has never drank.  Drinks 2 caffeinated drinks per day. Patient's occupation Research officer, trade union.    REVIEW OF SYSTEMS:    GU Review Male:   Patient reports frequent urination, hard to postpone urination, get up at night to urinate, and have to strain to urinate . Patient denies burning/ pain with urination, leakage of urine, stream starts and stops, trouble starting your stream, erection problems, and penile pain.  Gastrointestinal (Upper):   Patient denies vomiting, indigestion/ heartburn, and nausea.  Gastrointestinal (Lower):   Patient denies diarrhea and constipation.  Constitutional:   Patient denies fever, night sweats, weight loss, and fatigue.  Skin:   Patient denies skin rash/ lesion and itching.  Eyes:   Patient denies blurred vision and double vision.  Ears/ Nose/  Throat:   Patient denies sore throat and sinus problems.  Hematologic/Lymphatic:   Patient denies swollen glands and easy bruising.  Cardiovascular:   Patient denies leg swelling and chest pains.  Respiratory:   Patient denies cough and shortness of breath.  Endocrine:   Patient denies excessive thirst.  Musculoskeletal:   Patient denies back pain and joint pain.  Neurological:   Patient reports headaches. Patient denies dizziness.  Psychologic:   Patient denies depression and anxiety.   Notes: Right flank pain    VITAL SIGNS:      02/21/2020 03:25 PM  Weight 178 lb / 80.74 kg  Height 66 in / 167.64 cm  BP 153/83 mmHg  Pulse 81 /min  Temperature 97.3 F / 36.2 C  BMI 28.7 kg/m   GU PHYSICAL EXAMINATION:      Notes: NO CVA tenderness   MULTI-SYSTEM PHYSICAL EXAMINATION:    Constitutional: Well-nourished. No physical deformities. Normally developed. Good grooming.  Respiratory: No labored breathing, no use of accessory muscles.   Cardiovascular: Normal temperature, normal extremity pulses, no swelling, no varicosities.  Lymphatic: No enlargement of neck, axillae, groin.  Skin: No paleness, no jaundice, no cyanosis. No lesion, no ulcer, no rash.  Neurologic / Psychiatric: Oriented to time, oriented to place, oriented to person. No depression, no anxiety, no agitation.  Gastrointestinal: No mass, no tenderness, no rigidity, non obese abdomen.  Musculoskeletal: Normal gait and station of head and neck.     Complexity of Data:  Source Of  History:  Patient, Family/Caregiver, Medical Record Summary  Records Review:   Previous Doctor Records, Previous Patient Records  Urine Test Review:   Urinalysis  X-Ray Review: C.T. Abdomen/Pelvis: Reviewed Films. Reviewed Report. Discussed With Patient.     01/14/18  PSA  Total PSA 0.23 ng/mL    02/21/20  Urinalysis  Urine Appearance Clear   Urine Color Amber   Urine Glucose Neg mg/dL  Urine Bilirubin Neg mg/dL  Urine Ketones Neg mg/dL   Urine Specific Gravity 1.030   Urine Blood 2+ ery/uL  Urine pH 5.5   Urine Protein 1+ mg/dL  Urine Urobilinogen 1.0 mg/dL  Urine Nitrites Neg   Urine Leukocyte Esterase Neg leu/uL  Urine WBC/hpf 0 - 5/hpf   Urine RBC/hpf 3 - 10/hpf   Urine Epithelial Cells NS (Not Seen)   Urine Bacteria Rare (0-9/hpf)   Urine Mucous Not Present   Urine Yeast NS (Not Seen)   Urine Trichomonas Not Present   Urine Cystals NS (Not Seen)   Urine Casts NS (Not Seen)   Urine Sperm Not Present    PROCEDURES:         C.T. Urogram - [id number]      Patient confirmed No Neulasta OnPro Device.        PVR Ultrasound - [id number]  Scanned Volume: 0 cc         Urinalysis w/Scope - 81001 Dipstick Dipstick Cont'd Micro  Color: Amber Bilirubin: Neg mg/dL WBC/hpf: 0 - 5/hpf  Appearance: Clear Ketones: Neg mg/dL RBC/hpf: 3 - 97/QBH  Specific Gravity: 1.030 Blood: 2+ ery/uL Bacteria: Rare (0-9/hpf)  pH: 5.5 Protein: 1+ mg/dL Cystals: NS (Not Seen)  Glucose: Neg mg/dL Urobilinogen: 1.0 mg/dL Casts: NS (Not Seen)    Nitrites: Neg Trichomonas: Not Present    Leukocyte Esterase: Neg leu/uL Mucous: Not Present      Epithelial Cells: NS (Not Seen)      Yeast: NS (Not Seen)      Sperm: Not Present    Notes: unspun microscopic performed    ASSESSMENT:      ICD-10 Details  1 GU:   Ureteral calculus - N20.1 Right, Acute, Systemic Symptoms   PLAN:            Medications New Meds: Tamsulosin Hcl 0.4 mg capsule 1 capsule PO Q HS   #30  3 Refill(s)  Hydrocodone-Acetaminophen 5 mg-325 mg tablet 1 tablet PO Q 6 H PRN for severe pain  #20  0 Refill(s)  Ondansetron Hcl 4 mg tablet 1 tablet PO Q 6 H PRN nausea  #30  0 Refill(s)            Orders X-Rays: [facility] Protocol Without Contrast - Flank pain  X-Ray Notes: History:  Hematuria: Yes/No  Patient to see MD after exam: Yes/No  Previous exam: CT / IVP/ US/ KUB/ None  When:  Where:  Diabetic: Yes/ No  BUN/ Creatine:  Date of last BUN  Creatinine:  Weight in pounds:  Allergy- Contrasts/ Shellfish: Yes/ No  Conflicting diabetic meds: Yes/ No  Oral contrast and instructions given to patient:   Prior Authorization #: [id number] valid 02/21/20 thru 05/21/20            Schedule Return Visit/Planned Activity: ASAP - Schedule Surgery          Document Letter(s):  Created for Patient: Clinical Summary         Notes:   Urinalysis is not concerning for an infectious process today. CT scan shows a  8 mm obstructing right proximal stone that is easily visible on scout imaging. We discussed stone management options including medical expulsive therapy, ESWL, and ureteroscopy. Risks of all were discussed in detail. He would like to proceed with ESWL. He understands that there is a risk of failure of procedure, risk of incomplete fragmentation of stones, risk of worsening obstruction, risk of infection, and risk for bruising or bleeding. He was agreeable to these risks. I discussed this with his urologist who agreed to proceed we WEL. Pain and nausea medication sent to his pharmacy. I also sent in a prescription for tamsulosin. Advised strict return precautions in the interval. He verbalized understanding.   Of note, he is going to [place] for 2 weeks in the middle of November. He would like to get this procedure scheduled prior to this. I did advised that if he went to [place] he would need to follow up in the emergency room there and he developed any worsening symptoms. Those were explained in detail and discussed. He was agreeable.

## 2020-02-25 ENCOUNTER — Encounter (HOSPITAL_BASED_OUTPATIENT_CLINIC_OR_DEPARTMENT_OTHER): Payer: Self-pay | Admitting: Urology

## 2020-02-28 ENCOUNTER — Ambulatory Visit: Payer: Managed Care, Other (non HMO) | Admitting: Neurology

## 2020-02-28 ENCOUNTER — Encounter: Payer: Self-pay | Admitting: Neurology

## 2020-02-28 VITALS — BP 142/93 | HR 102 | Ht 66.0 in | Wt 179.0 lb

## 2020-02-28 DIAGNOSIS — R519 Headache, unspecified: Secondary | ICD-10-CM

## 2020-02-28 DIAGNOSIS — G4700 Insomnia, unspecified: Secondary | ICD-10-CM

## 2020-02-28 DIAGNOSIS — R0683 Snoring: Secondary | ICD-10-CM | POA: Diagnosis not present

## 2020-02-28 DIAGNOSIS — R0689 Other abnormalities of breathing: Secondary | ICD-10-CM | POA: Diagnosis not present

## 2020-02-28 DIAGNOSIS — R351 Nocturia: Secondary | ICD-10-CM

## 2020-02-28 DIAGNOSIS — G4719 Other hypersomnia: Secondary | ICD-10-CM

## 2020-02-28 NOTE — Progress Notes (Signed)
Subjective:    Patient ID: Adam Ball is a 54 y.o. male.  HPI     Adam Foley, MD, PhD [facility] [address], [address] [address] [place], [place] [address]  Dear Maralyn Sago and Mellody Dance,   I saw your patient, Adam Ball, in your kind request in my sleep clinic today for initial consultation of his sleep disorder, in particular, concern for underlying obstructive sleep apnea.  The patient is unaccompanied today.  As you know, Adam Ball is a 54 year old right-handed gentleman with an underlying medical history of kidney stones, BPH, reflux disease, irritable bowel syndrome, thalassemia trait, migraine headaches, and overweight state, who reports snoring and excessive daytime somnolence as well as waking up with a gasping sensation.  He has chronic difficulty initiating and maintaining sleep.  I reviewed your office note from 01/25/2020.   He had a sleep study several years ago but it was reportedly normal, he has not been on CPAP therapy.  Prior sleep study results are not available for my review today.  His Epworth sleepiness score is 8/24, fatigue severity score is 32 out of 6-3.  He has tried over-the-counter medication including melatonin which did not help.  He reports that he woke up with a sense of gasping a few times.  He has had occasional morning headaches and has nocturia in the recent past about once per average night.  He had recent lithotripsy last week.  He limits his caffeine to 2 cups of tea per day on average.  He is a non-smoker and does not drink alcohol.  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  He is married and lives with his wife and youngest daughter.  He has 2 other daughters.  He had 2 sleep studies several years ago.  One was done in [place] and the other in [facility] at the regional hospital.  He has never been on CPAP therapy.  He would be willing to consider CPAP treatment.  He  has no family history of sleep apnea.  He had a tonsillectomy as a child.  He has tried a prescription medication for sleep which he brought from [place].  We were able to look it up, he has been on alprazolam 0.5 mg strength as needed, does not take it every night, may take it once every 7 to 10 days.  He tries to be in bed around 10 but it may be 2 to 3 hours before he actually falls asleep.  His rise time from work is around 5.  He does have a TV in the bedroom and tries to turn it off before falling asleep.  His Past Medical History Is Significant For: Past Medical History:  Diagnosis Date  . BPH (benign prostatic hyperplasia)   . Chronic insomnia 06/23/2019  . Common migraine with intractable migraine 06/23/2019  . GERD (gastroesophageal reflux disease)   . Headache    migraines  . IBS (irritable bowel syndrome)   . OSA on CPAP   . Renal calculi   . Thalassemia trait     His Past Surgical History Is Significant For: Past Surgical History:  Procedure Laterality Date  . COLONOSCOPY WITH PROPOFOL N/A 06/26/2015   Procedure: COLONOSCOPY WITH PROPOFOL;  Surgeon: Charolett Bumpers, MD;  Location: [facility];  Service: Endoscopy;  Laterality: N/A;  . EXTRACORPOREAL SHOCK WAVE LITHOTRIPSY Right 02/24/2020   Procedure: EXTRACORPOREAL SHOCK WAVE LITHOTRIPSY (ESWL);  Surgeon:  Crist Fat, MD;  Location: [facility];  Service: Urology;  Laterality: Right;  . NO PAST SURGERIES      His Family History Is Significant For: No family history on file.  His Social History Is Significant For: Social History   Socioeconomic History  . Marital status: Married    Spouse name: Not on file  . Number of children: 2  . Years of education: BA  . Highest education level: Not on file  Occupational History  . Occupation: Thermo Geophysicist/field seismologist  Tobacco Use  . Smoking status: Former Smoker    Types: Cigarettes  . Smokeless tobacco: Never Used  Substance and Sexual Activity  .  Alcohol use: No  . Drug use: No  . Sexual activity: Not on file  Other Topics Concern  . Not on file  Social History Narrative   Left handed   Soda- 1-2/week or less   Lives with wife and daughter   Social Determinants of Health   Financial Resource Strain:   . Difficulty of Paying Living Expenses: Not on file  Food Insecurity:   . Worried About Programme researcher, broadcasting/film/video in the Last Year: Not on file  . Ran Out of Food in the Last Year: Not on file  Transportation Needs:   . Lack of Transportation (Medical): Not on file  . Lack of Transportation (Non-Medical): Not on file  Physical Activity:   . Days of Exercise per Week: Not on file  . Minutes of Exercise per Session: Not on file  Stress:   . Feeling of Stress : Not on file  Social Connections:   . Frequency of Communication with Friends and Family: Not on file  . Frequency of Social Gatherings with Friends and Family: Not on file  . Attends Religious Services: Not on file  . Active Member of Clubs or Organizations: Not on file  . Attends Banker Meetings: Not on file  . Marital Status: Not on file    His Allergies Are:  No Known Allergies:   His Current Medications Are:  Outpatient Encounter Medications as of 02/28/2020  Medication Sig  . Melatonin 5 MG TABS Take 5 mg by mouth at bedtime.  . SUMAtriptan (IMITREX) 100 MG tablet Take 1 tablet (100 mg total) by mouth 2 (two) times daily as needed for up to 1 dose for migraine.  . [DISCONTINUED] ondansetron (ZOFRAN) 4 MG tablet Take 1 tablet (4 mg total) by mouth every 8 (eight) hours as needed for nausea or vomiting.  . [DISCONTINUED] traMADol (ULTRAM) 50 MG tablet Take 1-2 tablets (50-100 mg total) by mouth every 6 (six) hours as needed for moderate pain.  . [DISCONTINUED] nortriptyline (PAMELOR) 10 MG capsule Take 1 tablet at bedtime x 1 week, then take 2 tablets at bedtime x 1 week, then take 3 tablets   No facility-administered encounter medications on file as  of 02/28/2020.  :  Review of Systems:  Out of a complete 14 point review of systems, all are reviewed and negative with the exception of these symptoms as listed below:  Review of Systems  Neurological:       Pt presents today to discuss his sleep. Pt has had a couple sleep studies but has never used a cpap. Pt does endorse snoring.  Epworth Sleepiness Scale 0= would never doze 1= slight chance of dozing 2= moderate chance of dozing 3= high chance of dozing  Sitting and reading: 2 Watching TV: 1 Sitting inactive  in a public place (ex. Theater or meeting): 1 As a passenger in a car for an hour without a break: 2 Lying down to rest in the afternoon: 1 Sitting and talking to someone: 1 Sitting quietly after lunch (no alcohol): 0 In a car, while stopped in traffic: 0 Total: 8     Objective:  Neurological Exam  Physical Exam Physical Examination:   Vitals:   02/28/20 1524  BP: (!) 142/93  Pulse: (!) 102    General Examination: The patient is a very pleasant 54 y.o. male in no acute distress. He appears well-developed and well-nourished and well groomed.   HEENT: Normocephalic, atraumatic, pupils are equal, round and reactive to light, extraocular tracking is good without limitation to gaze excursion or nystagmus noted. Hearing is grossly intact. Face is symmetric with normal facial animation, with the exception of slightly underdeveloped appearing left chin area. Speech is clear with no dysarthria noted. There is no hypophonia. There is no lip, neck/head, jaw or voice tremor. Neck is supple with full range of passive and active motion. There are no carotid bruits on auscultation. Oropharynx exam reveals: mild mouth dryness, adequate dental hygiene and moderate airway crowding, due to small airway entry letter uvula, redundant soft palate, Mallampati class III, tonsils absent.  Tongue protrudes centrally and palate elevates symmetrically.  Has a minimal overbite, slightly skewed  teeth alignment and mildly smaller left side of the chin compared to the right side.  Chest: Clear to auscultation without wheezing, rhonchi or crackles noted.  Heart: S1+S2+0, regular and normal without murmurs, rubs or gallops noted.   Abdomen: Soft, non-tender and non-distended with normal bowel sounds appreciated on auscultation.  Extremities: There is no pitting edema in the distal lower extremities bilaterally.   Skin: Warm and dry without trophic changes noted.   Musculoskeletal: exam reveals no obvious joint deformities, tenderness or joint swelling or erythema.   Neurologically:  Mental status: The patient is awake, alert and oriented in all 4 spheres. His immediate and remote memory, attention, language skills and fund of knowledge are appropriate. There is no evidence of aphasia, agnosia, apraxia or anomia. Speech is clear with normal prosody and enunciation. Thought process is linear. Mood is normal and affect is normal.  Cranial nerves II - XII are as described above under HEENT exam.  Motor exam: Normal bulk, strength and tone is noted. There is no tremor, Romberg is negative. Fine motor skills and coordination: grossly intact.  Cerebellar testing: No dysmetria or intention tremor. There is no truncal or gait ataxia.  Sensory exam: intact to light touch in the upper and lower extremities.  Gait, station and balance: He stands easily. No veering to one side is noted. No leaning to one side is noted. Posture is age-appropriate and stance is narrow based. Gait shows normal stride length and normal pace. No problems turning are noted. Tandem walk is unremarkable.                Assessment and Plan:  In summary, Adam Ball is a very pleasant 54 y.o.-year old male with an underlying medical history of kidney stones, BPH, reflux disease, irritable bowel syndrome, thalassemia trait, migraine headaches, and overweight state, whose  history and physical exam are concerning for obstructive  sleep apnea (OSA). I had a long chat with the patient about my findings and the diagnosis of OSA, its prognosis and treatment options. We talked about medical treatments, surgical interventions and non-pharmacological approaches. I explained in particular the risks  and ramifications of untreated moderate to severe OSA, especially with respect to developing cardiovascular disease down the Road, including congestive heart failure, difficult to treat hypertension, cardiac arrhythmias, or stroke. Even type 2 diabetes has, in part, been linked to untreated OSA. Symptoms of untreated OSA include daytime sleepiness, memory problems, mood irritability and mood disorder such as depression and anxiety, lack of energy, as well as recurrent headaches, especially morning headaches. We talked about trying to maintain a healthy lifestyle in general, as well as the importance of weight control. We also talked about the importance of good sleep hygiene. I recommended the following at this time: sleep study.  I explained the sleep test procedure to the patient and also outlined possible surgical and non-surgical treatment options of OSA, including the use of a custom-made dental device (which would require a referral to a specialist dentist or oral surgeon), upper airway surgical options, such as traditional UPPP or a novel less invasive surgical option in the form of Inspire hypoglossal nerve stimulation (which would involve a referral to an ENT surgeon). I also explained the CPAP treatment option to the patient, who indicated that he would be willing to try CPAP if the need arises. I explained the importance of being compliant with PAP treatment, not only for insurance purposes but primarily to improve his symptoms, and for the patient's long term health benefit, including to reduce His cardiovascular risks. I answered all his questions today and the patient was in agreement. I plan to see him back after the sleep study is  completed and encouraged him to call with any interim questions, concerns, problems or updates.   Thank you very much for allowing me to participate in the care of this nice patient. If I can be of any further assistance to you please do not hesitate to talk to me.   Adam FoleySaima Jacquetta Polhamus, MD, PhD

## 2020-02-28 NOTE — Patient Instructions (Signed)
  Here is what we discussed today and what we came up with as our plan for you:    Based on your symptoms and your exam I believe you are at risk for obstructive sleep apnea (aka OSA), and I think we should proceed with a sleep study to determine whether you do or do not have OSA and how severe it is. Even, if you have mild OSA, I may want you to consider treatment with CPAP, as treatment of even borderline or mild sleep apnea can result and improvement of symptoms such as sleep disruption, daytime sleepiness, nighttime bathroom breaks, restless leg symptoms, improvement of headache syndromes, even improved mood disorder.   As explained, an attended sleep study meaning you get to stay overnight in the sleep lab, lets us monitor sleep-related behaviors such as sleep talking and leg movements in sleep, in addition to monitoring for sleep apnea.  A home sleep test is a screening tool for sleep apnea only, and unfortunately does not help with any other sleep-related diagnoses.  Please remember, the long-term risks and ramifications of untreated moderate to severe obstructive sleep apnea are: increased Cardiovascular disease, including congestive heart failure, stroke, difficult to control hypertension, treatment resistant obesity, arrhythmias, especially irregular heartbeat commonly known as A. Fib. (atrial fibrillation); even type 2 diabetes has been linked to untreated OSA.   Sleep apnea can cause disruption of sleep and sleep deprivation in most cases, which, in turn, can cause recurrent headaches, problems with memory, mood, concentration, focus, and vigilance. Most people with untreated sleep apnea report excessive daytime sleepiness, which can affect their ability to drive. Please do not drive if you feel sleepy. Patients with sleep apnea can also develop difficulty initiating and maintaining sleep (aka insomnia).   Having sleep apnea may increase your risk for other sleep disorders, including  involuntary behaviors sleep such as sleep terrors, sleep talking, sleepwalking.    Having sleep apnea can also increase your risk for restless leg syndrome and leg movements at night.   Please note that untreated obstructive sleep apnea may carry additional perioperative morbidity. Patients with significant obstructive sleep apnea (typically, in the moderate to severe degree) should receive, if possible, perioperative PAP (positive airway pressure) therapy and the surgeons and particularly the anesthesiologists should be informed of the diagnosis and the severity of the sleep disordered breathing.   I will likely see you back after your sleep study to go over the test results and where to go from there. We will call you after your sleep study to advise about the results (most likely, you will hear from Megan, my nurse) and to set up an appointment at the time, as necessary.    Our sleep lab administrative assistant will call you to schedule your sleep study and give you further instructions, regarding the check in process for the sleep study, arrival time, what to bring, when you can expect to leave after the study, etc., and to answer any other logistical questions you may have. If you don't hear back from her by about 2 weeks from now, please feel free to call her direct line at [phone] or you can call our general clinic number, or email us through My Chart.    

## 2020-03-01 ENCOUNTER — Telehealth: Payer: Self-pay

## 2020-03-01 NOTE — Telephone Encounter (Signed)
LVM for pt to call me back to schedule sleep study  

## 2020-03-27 ENCOUNTER — Ambulatory Visit (INDEPENDENT_AMBULATORY_CARE_PROVIDER_SITE_OTHER): Payer: Managed Care, Other (non HMO) | Admitting: Neurology

## 2020-03-27 DIAGNOSIS — R0689 Other abnormalities of breathing: Secondary | ICD-10-CM

## 2020-03-27 DIAGNOSIS — G4719 Other hypersomnia: Secondary | ICD-10-CM

## 2020-03-27 DIAGNOSIS — R0683 Snoring: Secondary | ICD-10-CM

## 2020-03-27 DIAGNOSIS — G4733 Obstructive sleep apnea (adult) (pediatric): Secondary | ICD-10-CM | POA: Diagnosis not present

## 2020-03-27 DIAGNOSIS — R519 Headache, unspecified: Secondary | ICD-10-CM

## 2020-03-27 DIAGNOSIS — R351 Nocturia: Secondary | ICD-10-CM

## 2020-03-27 DIAGNOSIS — G4700 Insomnia, unspecified: Secondary | ICD-10-CM

## 2020-04-16 NOTE — Progress Notes (Signed)
Patient referred by Dr. Anne Hahn and Margie Ege, NP, seen by me on 02/28/20, HST on 03/27/20.    Please call and notify the patient that the recent home sleep test showed obstructive sleep apnea. OSA is overall mild, but worth treating to see if he feels better after treatment. To that end I recommend treatment for this in the form of autoPAP, which means, that we don't have to bring him in for a sleep study with CPAP, but will let him try an autoPAP machine at home, through a DME company (of his choice, or as per insurance requirement). The DME representative will educate him on how to use the machine, how to put the mask on, etc. I have placed an order in the chart. Please send referral, talk to patient, send report to referring MD. We will need a FU in sleep clinic for 10 weeks post-PAP set up, please arrange that with me or one of our NPs. Thanks,   Huston Foley, MD, PhD [facility])

## 2020-04-16 NOTE — Procedures (Signed)
   [facility]  HOME SLEEP TEST (Watch PAT)  STUDY DATE: 03/27/20  DOB: 10-11-1965  MRN: [id number]  ORDERING CLINICIAN: Huston Foley, MD, PhD   REFERRING CLINICIAN: Dr. Stephanie Acre  CLINICAL INFORMATION/HISTORY: 54 year old man with a history of kidney stones, BPH, reflux disease, irritable bowel syndrome, thalassemia trait, migraine headaches, and overweight state, who reports snoring and excessive daytime somnolence as well as waking up with a gasping sensation.  He has chronic difficulty initiating and maintaining sleep.  Epworth sleepiness score: 8/24.  BMI: 28.7 kg/m  FINDINGS:   Total Record Time (hours, min): 9 H 6 min  Total Sleep Time (hours, min):  6 H 28 min   Percent REM (%):    22.78%   Calculated pAHI (per hour):  10.1       REM pAHI:    19.2    NREM pAHI: 8.9   Oxygen Saturation (%) Mean: 95  Minimum oxygen saturation (%):         88   O2 Saturation Range (%): 88-98  O2Saturation (minutes) <=88%: 0 min  Pulse Mean (bpm):    62  Pulse Range (48-99)   IMPRESSION: OSA (obstructive sleep apnea), mild   RECOMMENDATION:  This home sleep test demonstrates overall mild obstructive sleep apnea with a total AHI of 10.1/hour and O2 nadir of 88%. Given the patient's medical history and sleep related complaints, treatment with positive airway pressure is recommended. This can be achieved in the form of autoPAP trial/titration at home. A full night CPAP titration study will help with proper treatment settings and mask fitting, if needed, down the road. Alternative treatments include weight loss along with avoidance of the supine sleep position, or an oral appliance in appropriate candidates.   Please note that untreated obstructive sleep apnea may carry additional perioperative morbidity. Patients with significant obstructive sleep apnea should receive perioperative PAP therapy and the surgeons and particularly the anesthesiologist should be informed of  the diagnosis and the severity of the sleep disordered breathing. The patient should be cautioned not to drive, work at heights, or operate dangerous or heavy equipment when tired or sleepy. Review and reiteration of good sleep hygiene measures should be pursued with any patient. Other causes of the patient's symptoms, including circadian rhythm disturbances, an underlying mood disorder, medication effect and/or an underlying medical problem cannot be ruled out based on this test. Clinical correlation is recommended.   The patient and his referring provider will be notified of the test results. The patient will be seen in follow up in sleep clinic at [facility].  I certify that I have reviewed the raw data recording prior to the issuance of this report in accordance with the standards of the American Academy of Sleep Medicine (AASM).  INTERPRETING PHYSICIAN:    Huston Foley, MD, PhD  Board Certified in Neurology and Sleep Medicine [facility] [address], [address] [place], [place] [address] [phone]

## 2020-04-16 NOTE — Addendum Note (Signed)
Addended by: Huston Foley on: 04/16/2020 11:17 AM   Modules accepted: Orders

## 2020-04-17 ENCOUNTER — Telehealth: Payer: Self-pay

## 2020-04-17 NOTE — Telephone Encounter (Signed)
Pt's daughter, Maralyn Sago, per DPR, returned my call. I discussed pt's sleep study results and recommendations with her. Pt will start an auto-pap. I discussed cpap compliance requirements. A follow up was made for 07/18/2020 at 3:15pm with Maralyn Sago, NP. Pt's daughter verbalized understanding of results and had no further questions or concerns.  DME: Christoper Allegra. Order for auto pap sent to [facility] via fax. Confirmation received that the order transmitted was successful.

## 2020-04-17 NOTE — Telephone Encounter (Signed)
I called pt to discuss. No answer, left a messag asking him to call me back. 

## 2020-04-17 NOTE — Telephone Encounter (Signed)
-----   Message from Huston Foley, MD sent at 04/16/2020 11:17 AM EST ----- Patient referred by Dr. Anne Hahn and Margie Ege, NP, seen by me on 02/28/20, HST on 03/27/20.    Please call and notify the patient that the recent home sleep test showed obstructive sleep apnea. OSA is overall mild, but worth treating to see if he feels better after treatment. To that end I recommend treatment for this in the form of autoPAP, which means, that we don't have to bring him in for a sleep study with CPAP, but will let him try an autoPAP machine at home, through a DME company (of his choice, or as per insurance requirement). The DME representative will educate him on how to use the machine, how to put the mask on, etc. I have placed an order in the chart. Please send referral, talk to patient, send report to referring MD. We will need a FU in sleep clinic for 10 weeks post-PAP set up, please arrange that with me or one of our NPs. Thanks,   Huston Foley, MD, PhD [facility])

## 2020-06-01 ENCOUNTER — Ambulatory Visit: Payer: Managed Care, Other (non HMO) | Admitting: Neurology

## 2020-06-26 ENCOUNTER — Other Ambulatory Visit: Payer: Self-pay | Admitting: Neurology

## 2020-07-18 ENCOUNTER — Ambulatory Visit: Payer: Self-pay | Admitting: Neurology

## 2020-11-15 IMAGING — DX DG ABDOMEN 1V
1 series · 1 of 1 positions shown · non-contrast
Comparison: CT abdomen pelvis, 02/21/2020

CLINICAL DATA: Right ureteral stone

EXAM:
ABDOMEN - 1 VIEW

[abdomen kub]
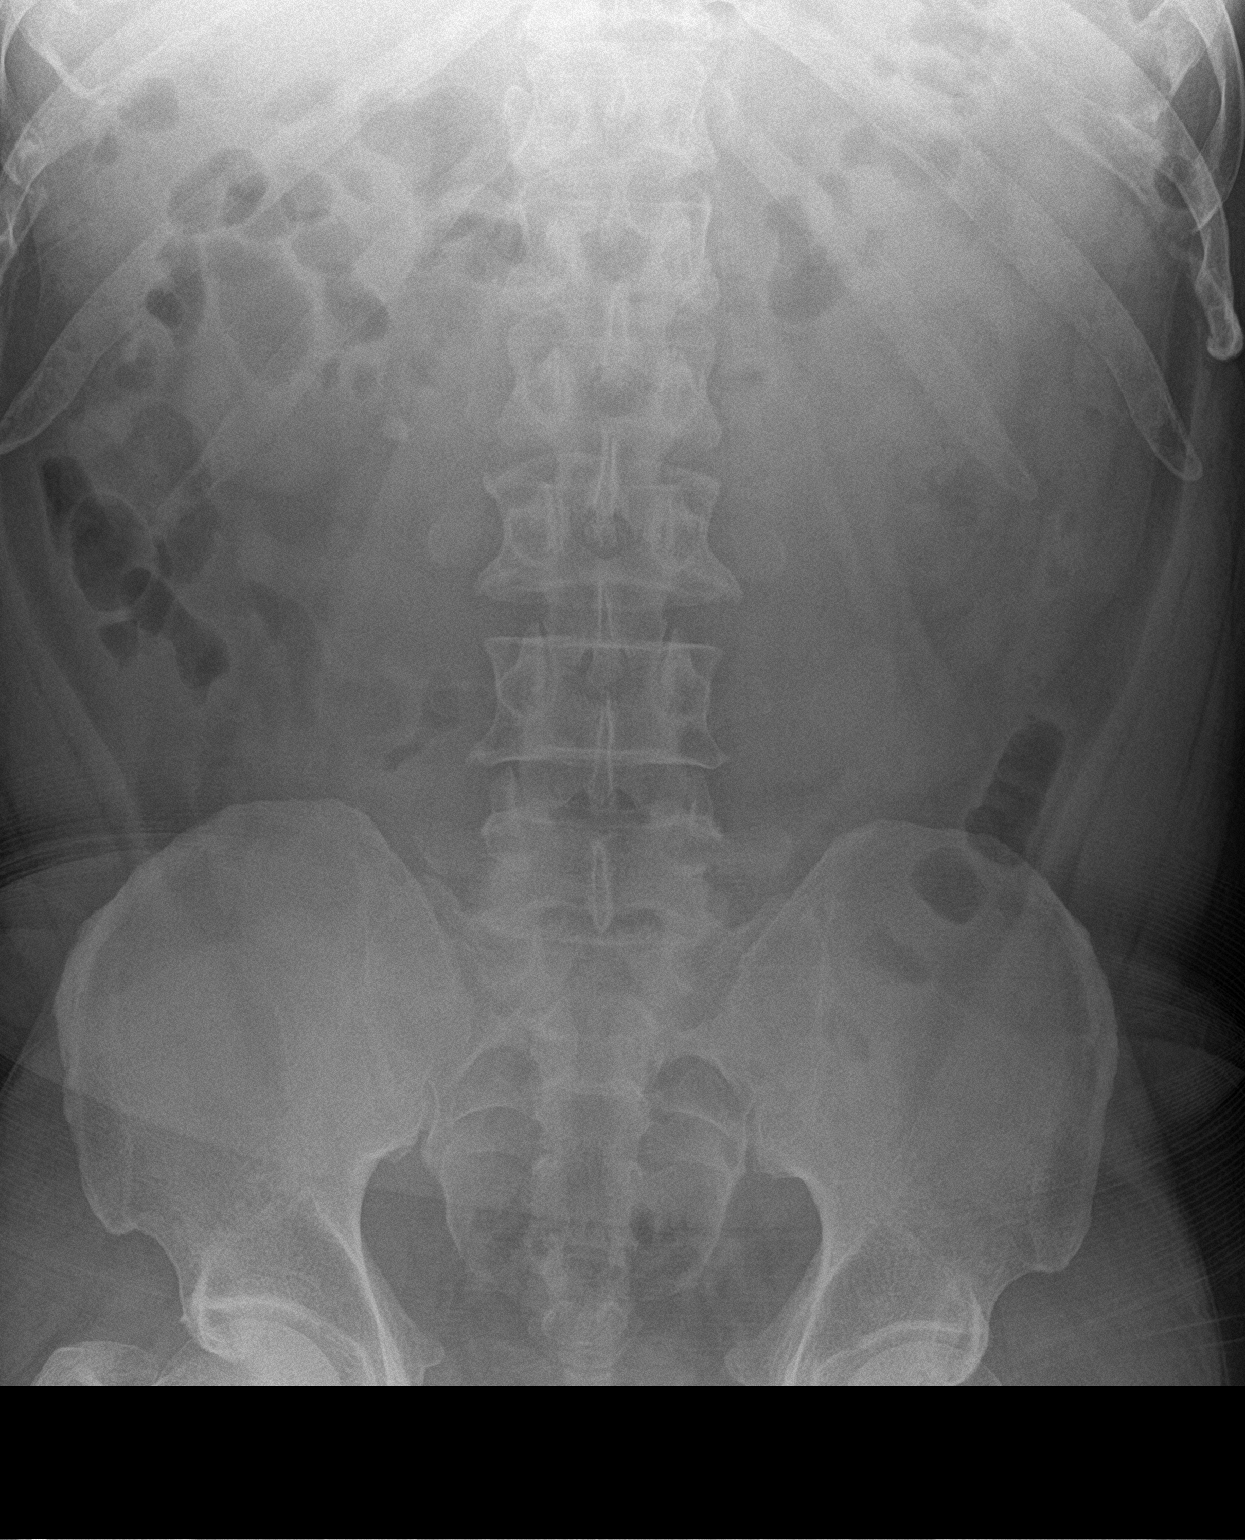

[1 of 1 positions shown; findings below may reference images not displayed]

FINDINGS: Calculus projecting in the vicinity of the proximal right ureter is
not significantly changed in comparison to recent CT dated
02/21/2020. No additional calculi appreciated radiographically.
Nonobstructive pattern of overlying bowel gas. The osseous
structures are unremarkable.
IMPRESSION: Calculus projecting in the vicinity of the proximal right ureter is
not significantly changed in comparison to recent CT dated
02/21/2020. No additional calculi appreciated radiographically.
